# Patient Record
Sex: Female | Born: 1995 | Race: Black or African American | Hispanic: No | Marital: Single | Smoking: Never smoker
Health system: Southern US, Community
[De-identification: ages and names within clinical notes are randomized; demographics above are authoritative.]

## PROBLEM LIST (undated history)

## (undated) DIAGNOSIS — E785 Hyperlipidemia, unspecified: Secondary | ICD-10-CM

## (undated) DIAGNOSIS — D649 Anemia, unspecified: Secondary | ICD-10-CM

---

## 2015-01-05 ENCOUNTER — Encounter (HOSPITAL_COMMUNITY): Payer: Self-pay | Admitting: Emergency Medicine

## 2015-01-05 ENCOUNTER — Emergency Department (HOSPITAL_COMMUNITY)
Admission: EM | Admit: 2015-01-05 | Discharge: 2015-01-05 | Disposition: A | Discharge status: Home / Self Care | Payer: Medicaid Other | Attending: Emergency Medicine | Admitting: Emergency Medicine

## 2015-01-05 DIAGNOSIS — R300 Dysuria: Secondary | ICD-10-CM | POA: Diagnosis present

## 2015-01-05 DIAGNOSIS — Z3202 Encounter for pregnancy test, result negative: Secondary | ICD-10-CM | POA: Diagnosis not present

## 2015-01-05 DIAGNOSIS — N390 Urinary tract infection, site not specified: Secondary | ICD-10-CM | POA: Insufficient documentation

## 2015-01-05 LAB — URINALYSIS, ROUTINE W REFLEX MICROSCOPIC
Bilirubin Urine: NEGATIVE
GLUCOSE, UA: NEGATIVE mg/dL
Ketones, ur: 15 mg/dL — AB
NITRITE: NEGATIVE
PROTEIN: 30 mg/dL — AB
Specific Gravity, Urine: 1.019 (ref 1.005–1.030)
UROBILINOGEN UA: 0.2 mg/dL (ref 0.0–1.0)
pH: 6 (ref 5.0–8.0)

## 2015-01-05 LAB — URINE MICROSCOPIC-ADD ON

## 2015-01-05 LAB — POC URINE PREG, ED: Preg Test, Ur: NEGATIVE

## 2015-01-05 MED ORDER — NITROFURANTOIN MONOHYD MACRO 100 MG PO CAPS: 100.0000 mg | ORAL_CAPSULE | Freq: Two times a day (BID) | ORAL | Status: AC

## 2015-01-05 MED ORDER — NITROFURANTOIN MONOHYD MACRO 100 MG PO CAPS
100.0000 mg | ORAL_CAPSULE | Freq: Once | ORAL | Status: AC
  Administered 2015-01-05: 100 mg via ORAL
  Filled 2015-01-05: qty 1

## 2015-01-05 NOTE — ED Notes (Signed)
Pt. reports dysuria and malodorous urine onset 3 days ago , denies fever or chills.

## 2015-01-05 NOTE — ED Provider Notes (Signed)
CSN: [id number]     Arrival date & time 01/05/15  1912 History   First MD Initiated Contact with Patient 01/05/15 1925     Chief Complaint  Patient presents with  . Urinary Tract Infection     (Consider location/radiation/quality/duration/timing/severity/associated sxs/prior Treatment) HPI   This is a generally healthy 19 year old female with no significant past medical history who presents for evaluation of dysuria. Patient reports for the past 3 days she has noticed urinary frequency, urgency, strong urine odor, and burning with urination. Symptom has been persistent. No associated fever, chills, abdominal pain, back pain, vaginal bleeding vaginal discharge, or rash. She denies any pain with sexual activities, no prior history of STD. She is sexually active with one partner using protection every single time. Her last menstrual period was several days ago. She is currently on birth control, Ortho Tricyclic. Patient denies history of recurrent urinary tract infection, states she has never had a urinary tract infection in the past.  History reviewed. No pertinent past medical history. History reviewed. No pertinent past surgical history. No family history on file. History  Substance Use Topics  . Smoking status: Never Smoker   . Smokeless tobacco: Not on file  . Alcohol Use: Yes   OB History    No data available     Review of Systems  All other systems reviewed and are negative.     Allergies  Review of patient's allergies indicates no known allergies.  Home Medications   Prior to Admission medications   Not on File   BP 111/60 mmHg  Pulse 73  Temp(Src) 98.7 F (37.1 C) (Oral)  Resp 14  SpO2 100%  LMP 12/31/2014 Physical Exam  Constitutional: She appears well-developed and well-nourished. No distress.  HENT:  Head: Atraumatic.  Eyes: Conjunctivae are normal.  Neck: Neck supple.  Cardiovascular: Normal rate and regular rhythm.   Pulmonary/Chest: Effort normal and  breath sounds normal.  Abdominal: Soft. Bowel sounds are normal. She exhibits no distension. There is no tenderness. There is no rebound and no guarding.  Genitourinary:  No CVA tenderness  Neurological: She is alert.  Skin: No rash noted.  Psychiatric: She has a normal mood and affect.  Nursing note and vitals reviewed.   ED Course  Procedures (including critical care time)  7:38 PM Patient presents with dysuria. She is afebrile with stable normal vital sign and a benign abdomen. No pelvic pain on examination. No CVA tenderness.  9:41 PM Pregnancy test is negative, UA shows evidence of a urinary tract infection. This is uncomplicated urinary tract infection and will be treated with Macrobid. Patient is stable for discharge. Resources provided. Return precautions discussed.  Labs Review Labs Reviewed  URINALYSIS, ROUTINE W REFLEX MICROSCOPIC - Abnormal; Notable for the following:    APPearance CLOUDY (*)    Hgb urine dipstick LARGE (*)    Ketones, ur 15 (*)    Protein, ur 30 (*)    Leukocytes, UA LARGE (*)    All other components within normal limits  URINE MICROSCOPIC-ADD ON - Abnormal; Notable for the following:    Squamous Epithelial / LPF FEW (*)    Bacteria, UA MANY (*)    All other components within normal limits  POC URINE PREG, ED  POC URINE PREG, ED    Imaging Review No results found.   EKG Interpretation None      MDM   Final diagnoses:  UTI (lower urinary tract infection)    BP 104/55 mmHg  Pulse  75  Temp(Src) 98.3 F (36.8 C) (Oral)  Resp 14  SpO2 100%  LMP 12/31/2014     Fayrene HelperBowie Khalessi Blough, PA-C 01/05/15 2142  Flint MelterElliott L Wentz, MD 01/06/15 0025

## 2015-01-05 NOTE — Discharge Instructions (Signed)
Urinary Tract Infection °Urinary tract infections (UTIs) can develop anywhere along your urinary tract. Your urinary tract is your body's drainage system for removing wastes and extra water. Your urinary tract includes two kidneys, two ureters, a bladder, and a urethra. Your kidneys are a pair of bean-shaped organs. Each kidney is about the size of your fist. They are located below your ribs, one on each side of your spine. °CAUSES °Infections are caused by microbes, which are microscopic organisms, including fungi, viruses, and bacteria. These organisms are so small that they can only be seen through a microscope. Bacteria are the microbes that most commonly cause UTIs. °SYMPTOMS  °Symptoms of UTIs may vary by age and gender of the patient and by the location of the infection. Symptoms in young women typically include a frequent and intense urge to urinate and a painful, burning feeling in the bladder or urethra during urination. Older women and men are more likely to be tired, shaky, and weak and have muscle aches and abdominal pain. A fever may mean the infection is in your kidneys. Other symptoms of a kidney infection include pain in your back or sides below the ribs, nausea, and vomiting. °DIAGNOSIS °To diagnose a UTI, your caregiver will ask you about your symptoms. Your caregiver also will ask to provide a urine sample. The urine sample will be tested for bacteria and white blood cells. White blood cells are made by your body to help fight infection. °TREATMENT  °Typically, UTIs can be treated with medication. Because most UTIs are caused by a bacterial infection, they usually can be treated with the use of antibiotics. The choice of antibiotic and length of treatment depend on your symptoms and the type of bacteria causing your infection. °HOME CARE INSTRUCTIONS °· If you were prescribed antibiotics, take them exactly as your caregiver instructs you. Finish the medication even if you feel better after you  have only taken some of the medication. °· Drink enough water and fluids to keep your urine clear or pale yellow. °· Avoid caffeine, tea, and carbonated beverages. They tend to irritate your bladder. °· Empty your bladder often. Avoid holding urine for long periods of time. °· Empty your bladder before and after sexual intercourse. °· After a bowel movement, women should cleanse from front to back. Use each tissue only once. °SEEK MEDICAL CARE IF:  °· You have back pain. °· You develop a fever. °· Your symptoms do not begin to resolve within 3 days. °SEEK IMMEDIATE MEDICAL CARE IF:  °· You have severe back pain or lower abdominal pain. °· You develop chills. °· You have nausea or vomiting. °· You have continued burning or discomfort with urination. °MAKE SURE YOU:  °· Understand these instructions. °· Will watch your condition. °· Will get help right away if you are not doing well or get worse. °Document Released: 08/22/2005 Document Revised: 05/13/2012 Document Reviewed: 12/21/2011 °ExitCare® Patient Information ©2015 [employer], [employer]. This information is not intended to replace advice given to you by your health care provider. Make sure you discuss any questions you have with your health care provider. ° ° °Emergency Department Resource Guide °1) Find a Doctor and Pay Out of Pocket °Although you won't have to find out who is covered by your insurance plan, it is a good idea to ask around and get recommendations. You will then need to call the office and see if the doctor you have chosen will accept you as a new patient and what types of   options they offer for patients who are self-pay. Some doctors offer discounts or will set up payment plans for their patients who do not have insurance, but you will need to ask so you aren't surprised when you get to your appointment. ° °2) Contact Your Local Health Department °Not all health departments have doctors that can see patients for sick visits, but many do, so it is worth  a call to see if yours does. If you don't know where your local health department is, you can check in your phone book. The CDC also has a tool to help you locate your state's health department, and many state websites also have listings of all of their local health departments. ° °3) Find a Walk-in Clinic °If your illness is not likely to be very severe or complicated, you may want to try a walk in clinic. These are popping up all over the country in pharmacies, drugstores, and shopping centers. They're usually staffed by nurse practitioners or physician assistants that have been trained to treat common illnesses and complaints. They're usually fairly quick and inexpensive. However, if you have serious medical issues or chronic medical problems, these are probably not your best option. ° °No Primary Care Doctor: °- Call [facility] at  832-8000 - they can help you locate a primary care doctor that  accepts your insurance, provides certain services, etc. °- Physician Referral Service- 1-800-533-3463 ° °Chronic Pain Problems: °Organization         Address  Phone   Notes  °[facility]  (336) 297-2271 Patients need to be referred by their primary care doctor.  ° °Medication Assistance: °Organization         Address  Phone   Notes  °[place] [facility] [address] °[place], [place] [address] (336) 641-8030 --Must be a resident of [place] °-- Must have NO insurance coverage whatsoever (no Medicaid/ Medicare, etc.) °-- The pt. MUST have a primary care doctor that directs their care regularly and follows them in the community °  °[facility]  (866) 331-1348   °[employer]  (888) 892-1162   ° °Agencies that provide inexpensive medical care: °Organization         Address  Phone   Notes  °[facility]  (336) 832-8035   °[facility]    (336) 832-7272   °[facility] [address] °[place], [place]  [address] (336) 832-4777   °[facility] [address], °[place] (336) 271-4999   °[facility]    (336) 373-0678   °[facility]    (336) 272-1050   °[facility] ° [address], [place] Phone:  (336) 832-4444, Fax:  (336) 832-4440 Hours of Operation:  9 am - 6 pm, M-F.  Also accepts Medicaid/Medicare and self-pay.  °[facility] ° [address], [place] Phone: (336) 832-3150, Fax: (336) 832-3151. Hours of Operation:  8:30 am - 5:30 pm, M-F.  Also accepts Medicaid and self-pay.  °[facility] [place] [address], [place] Phone: (336) 878-6027   °[facility] [address], [place],  (336)723-1848, Ext. 123 Mondays & Thursdays: 7-9 AM.  First 15 patients are seen on a first come, first serve basis. °  ° °Medicaid-accepting [place] Providers: ° °Organization         Address  Phone   Notes  °  [facility] [address], [place] (336) 641-2100 Also accepts self-pay patients.  °[facility] [address], [place] ° (336) 856-9996   °[facility] [address], [place] (336) 288-8857   °[facility] [address] [place] [address], [place] (336) 299-7000   °Veita Bland [address], [place]  ° (336) 373-1557 Only accepts [place] Access Medicaid patients after they have their name applied to their card.  ° °Self-Pay (no insurance) in [place]: ° °Organization         Address  Phone   Notes  °Sickle Cell Patients, [facility] [address], [place] (336) 832-1970   °[facility] [address], [place] (336) 832-4400   °[facility] ° [address], [place] (336) 992-4800   °[facility]/Dr. Osei-Bonsu ° [address] [place] [address], [place] or [address], [place] (336) [phone] Phone number for both [place] and [place] locations is the same.  °[facility] [address], [place] (336) 299-0000   °[facility] [address] [place] [address], [place] or [address] (336) 852-7530 °(336) 878-2260   °[facility] [address], [place] (336) 350-1642, phone; (336) 294-5005, fax Sees patients 1st and 3rd Saturday of every month.  Must not qualify for public or private insurance (i.e. Medicaid, Medicare, [place] Health Choice, Veterans' Benefits) • Household income should be no more than 200% of the poverty level •The clinic cannot treat you if you are pregnant or think you are pregnant • Sexually transmitted diseases are not treated at the clinic.  ° ° °Dental Care: °Organization         Address  Phone  Notes  °[facility] [facility] [address], [place] (336) 641-6152 Accepts children up to age 21 who are enrolled in Medicaid or [place] Health Choice; pregnant women with a Medicaid card; and children who have applied for Medicaid or [place] Health Choice, but were declined, whose parents can pay a reduced fee at time of service.  °[facility] [place]  [address], [place] (336) 641-7733 Accepts children up to age 21 who are enrolled in Medicaid or [place] Health Choice; pregnant women with a Medicaid card; and children who have applied for Medicaid or [place] Health Choice, but were declined, whose parents can pay a reduced fee at time of service.  °[facility] ° [address], [place] (336) 641-4533 Patients are seen by appointment only. Walk-ins are not accepted. [facility] will see patients 18 years of age and older. °Monday - Tuesday (8am-5pm) °Most Wednesdays (8:30-5pm) °$30 per visit, cash only  °[facility] ° [address], [place] (336) 641-4533  Patients are seen by appointment only. Walk-ins are not accepted. [facility] will see patients 18 years of age and older. °One Wednesday Evening (Monthly: Volunteer Based).  $30 per visit, cash only  °[facility]  (919) 537-3737 for adults; Children under age 4, call Graduate Pediatric Dentistry at ([phone]) [phone]. Children aged 4-14, please call (919) [phone] to request a pediatric application. ° Dental services are provided in all areas of dental care including fillings, crowns and bridges, complete and partial   dentures, implants, gum treatment, root canals, and extractions. Preventive care is also provided. Treatment is provided to both adults and children. °Patients are selected via a lottery and there is often a waiting list. °  °[facility] [address], °[place] ° (336) 763-8833 [email or web address] °  °[facility] [address], [place], [place] (336)723-1848, Ext. 123 Second and Fourth Thursday of each month, opens at 6:30 AM; Clinic ends at 9 AM.  Patients are seen on a first-come first-served basis, and a limited number are seen during each clinic.  ° °[facility] ° [address], [place], [place] (336) 723-7904   Eligibility Requirements °You must have lived in [place], [place], or [place] counties for at least the last three months. °  You cannot be eligible for state or federal sponsored healthcare insurance, including Veterans Administration, Medicaid, or Medicare. °  You generally cannot be eligible for healthcare insurance through your employer.  °  How to apply: °Eligibility screenings are held every Tuesday and Wednesday afternoon from 1:00 pm until 4:00 pm. You do not need an appointment for the interview!  °[facility] [address], [place], [place] 336-631-2330   °[facility]  336-342-8273   °[place] [facility]  336-703-3100   °[facility]   336-570-6415   ° °Behavioral Health Resources in the Community: °Intensive Outpatient Programs °Organization         Address  Phone  Notes  °[facility] [address], [place], [place] 336-878-6098   °[facility] [address], [place], [place] 336-832-9800   °[facility] [address], [place], [place] ° 336-882-2125   °[facility] [address],  °[place], [place] 1-800-853-5163 or 336-641-4981   °Substance Abuse Resources °Organization         Address  Phone  Notes  °[facility]  336-882-2125   °[facility]  336-784-9470   °[facility]  336-285-9073   °[facility]  336-845-3988   °Residential & Outpatient Substance Abuse Program  1-800-659-3381   °Psychological Services °Organization         Address  Phone  Notes  °[facility]  336- 832-9600   °[facility]  336- 378-7881   °[facility] [address], [place] 1-800-853-5163 or 336-641-4981   ° °Mobile Crisis Teams °Organization         Address  Phone  Notes  °[facility], Mobile Crisis Care Unit  1-877-626-1772   °Assertive °[facility] ° [address] [place], [place] 336-834-9664   °Sharon DeEsch [address] °[place] 336-554-5454   ° °Self-Help/Support Groups °Organization         Address  Phone             Notes  °[facility] - variety of support groups  336- 373-1402 Call for more information  °Narcotics Anonymous (NA), [facility] [address], °[place]  2 meetings at this location  ° °Residential Treatment Programs °Organization         Address  Phone  Notes  °[facility] [address],    °[place]  1-866-801-8205   °[facility] ° [address], [place], [place] 704-293-8524   °[facility] [address], [place] 336-845-3988 Admissions: 8am-3pm M-F   °  [facility] [address]    °[place], [place] 336-841-1104   °[facility] [address], [place], [place] 336-379-7146   °[facility] [address]  °[place], [place] 336-285-9073   °[facility] Intensive Outpatient [address], [place], [place] 336-852-3033   °[facility] [address]  °[place], [place] 1-877-615-2722 or 336-784-9470   °[facility] ([facility] [address] [place], [place] 336-227-7417 Accepts Medicaid  °[facility] [address]  °[place] 1-800-659-3381 Substance Abuse/Addiction Treatment  ° °[place] Behavioral Health Resources °Organization         Address  Phone  Notes  °[facility]  (888) 581-9988   °Julie Brannon, PhD [address] [place], [place]   (336) 349-5553 or (336) 951-0000   °[facility]   [address] °[place], [place] (336) 349-4454   °[facility] [address], [place], [place] (336) 342-8316 Insurance/Medicaid/sponsorship through [facility]  °[facility] [address]                                    [place], [place] (336) 342-8316 Therapy/tele-psych/case  °[facility] [address]  ° [place],  (336) 349-2233    °Dr. Arfeen  (336) 349-4544   °[facility] [place]  [employer] [place] [facility] 1) [address], [place] °2) [address], [place] °3)  [address], [place] (336) 349-3220 °(336) 342-7768 ° °(336) 342-8140   °[place] [facility] (336) 342-1394 or (336) 342-3537 (After Hours)    ° ° ° ° °

## 2015-01-05 NOTE — ED Notes (Signed)
Bowie at the bedside to see the patient.

## 2015-01-12 ENCOUNTER — Encounter (HOSPITAL_COMMUNITY): Payer: Self-pay | Admitting: Family Medicine

## 2015-01-12 ENCOUNTER — Emergency Department (HOSPITAL_COMMUNITY): Payer: Medicaid Other

## 2015-01-12 ENCOUNTER — Emergency Department (HOSPITAL_COMMUNITY)
Admission: EM | Admit: 2015-01-12 | Discharge: 2015-01-12 | Disposition: A | Discharge status: Home / Self Care | Payer: Medicaid Other | Attending: Emergency Medicine | Admitting: Emergency Medicine

## 2015-01-12 DIAGNOSIS — Y9389 Activity, other specified: Secondary | ICD-10-CM | POA: Insufficient documentation

## 2015-01-12 DIAGNOSIS — S5002XA Contusion of left elbow, initial encounter: Secondary | ICD-10-CM | POA: Diagnosis not present

## 2015-01-12 DIAGNOSIS — W000XXA Fall on same level due to ice and snow, initial encounter: Secondary | ICD-10-CM | POA: Insufficient documentation

## 2015-01-12 DIAGNOSIS — S4992XA Unspecified injury of left shoulder and upper arm, initial encounter: Secondary | ICD-10-CM | POA: Diagnosis present

## 2015-01-12 DIAGNOSIS — Z792 Long term (current) use of antibiotics: Secondary | ICD-10-CM | POA: Diagnosis not present

## 2015-01-12 DIAGNOSIS — Y9289 Other specified places as the place of occurrence of the external cause: Secondary | ICD-10-CM | POA: Diagnosis not present

## 2015-01-12 DIAGNOSIS — S43402A Unspecified sprain of left shoulder joint, initial encounter: Secondary | ICD-10-CM | POA: Diagnosis not present

## 2015-01-12 DIAGNOSIS — Y998 Other external cause status: Secondary | ICD-10-CM | POA: Diagnosis not present

## 2015-01-12 MED ORDER — NAPROXEN 500 MG PO TABS: 500.0000 mg | ORAL_TABLET | Freq: Two times a day (BID) | ORAL | Status: AC

## 2015-01-12 NOTE — ED Notes (Signed)
Pt here after slip and fall on the ice a few days ago. sts hurt left shoulder and elbow.

## 2015-01-12 NOTE — Discharge Instructions (Signed)
Naprosyn for pain and inflammation. Keep arm in a sling for a week. Follow up with your doctor or with orthopedist as referred if not improving.   Shoulder Pain The shoulder is the joint that connects your arms to your body. The bones that form the shoulder joint include the upper arm bone (humerus), the shoulder blade (scapula), and the collarbone (clavicle). The top of the humerus is shaped like a ball and fits into a rather flat socket on the scapula (glenoid cavity). A combination of muscles and strong, fibrous tissues that connect muscles to bones (tendons) support your shoulder joint and hold the ball in the socket. Small, fluid-filled sacs (bursae) are located in different areas of the joint. They act as cushions between the bones and the overlying soft tissues and help reduce friction between the gliding tendons and the bone as you move your arm. Your shoulder joint allows a wide range of motion in your arm. This range of motion allows you to do things like scratch your back or throw a ball. However, this range of motion also makes your shoulder more prone to pain from overuse and injury. Causes of shoulder pain can originate from both injury and overuse and usually can be grouped in the following four categories:  Redness, swelling, and pain (inflammation) of the tendon (tendinitis) or the bursae (bursitis).  Instability, such as a dislocation of the joint.  Inflammation of the joint (arthritis).  Broken bone (fracture). HOME CARE INSTRUCTIONS   Apply ice to the sore area.  Put ice in a plastic bag.  Place a towel between your skin and the bag.  Leave the ice on for 15-20 minutes, 3-4 times per day for the first 2 days, or as directed by your health care provider.  Stop using cold packs if they do not help with the pain.  If you have a shoulder sling or immobilizer, wear it as long as your caregiver instructs. Only remove it to shower or bathe. Move your arm as little as possible,  but keep your hand moving to prevent swelling.  Squeeze a soft ball or foam pad as much as possible to help prevent swelling.  Only take over-the-counter or prescription medicines for pain, discomfort, or fever as directed by your caregiver. SEEK MEDICAL CARE IF:   Your shoulder pain increases, or new pain develops in your arm, hand, or fingers.  Your hand or fingers become cold and numb.  Your pain is not relieved with medicines. SEEK IMMEDIATE MEDICAL CARE IF:   Your arm, hand, or fingers are numb or tingling.  Your arm, hand, or fingers are significantly swollen or turn white or blue. MAKE SURE YOU:   Understand these instructions.  Will watch your condition.  Will get help right away if you are not doing well or get worse. Document Released: 08/22/2005 Document Revised: 03/29/2014 Document Reviewed: 10/27/2011 [facility] Patient Information 2015 [place]Care, [place]. This information is not intended to replace advice given to you by your health care provider. Make sure you discuss any questions you have with your health care provider.

## 2015-01-12 NOTE — ED Notes (Signed)
Patient transported to X-ray 

## 2015-01-12 NOTE — ED Provider Notes (Signed)
CSN: [id number]     Arrival date & time 01/12/15  1155 History  This chart was scribed for non-physician practitioner Jaynie Crumbleatyana Alayzia Pavlock, PA-C working with Juliet RudeNathan R. Rubin PayorPickering, MD by Littie Deedsichard Sun, ED Scribe. This patient was seen in room TR09C/TR09C and the patient's care was started at 1:55 PM.     Chief Complaint  Patient presents with  . Shoulder Pain   The history is provided by the patient. No language interpreter was used.   HPI Comments: Margaret Meyer is a 19 y.o. female who presents to the Emergency Department complaining of sudden onset left shoulder pain that started 2 days ago after slipping and falling on ice. Patient landed on her left arm and reports having left elbow pain in addition to her shoulder pain, but she states the shoulder pain is more severe. The pain is described as an 8/10 in severity. She has not tried anything for her pain. Patient does report having something like a tingling sensation, although she is not able to describe it well. She denies numbness.  Patient is in school currently and does not work. She is originally from [place] and is in the area for school.  History reviewed. No pertinent past medical history. History reviewed. No pertinent past surgical history. History reviewed. No pertinent family history. History  Substance Use Topics  . Smoking status: Never Smoker   . Smokeless tobacco: Not on file  . Alcohol Use: Yes   OB History    No data available     Review of Systems  Musculoskeletal: Positive for arthralgias. Negative for joint swelling.  Neurological: Negative for numbness.      Allergies  Nickel  Home Medications   Prior to Admission medications   Medication Sig Start Date End Date Taking? Authorizing Provider  nitrofurantoin, macrocrystal-monohydrate, (MACROBID) 100 MG capsule Take 1 capsule (100 mg total) by mouth 2 (two) times daily. X 7 days 01/05/15   Fayrene HelperBowie Tran, PA-C   BP 111/58 mmHg  Pulse 78  Temp(Src) 98 F (36.7  C) (Oral)  Resp 20  SpO2 100%  LMP 12/31/2014 Physical Exam  Constitutional: She is oriented to person, place, and time. She appears well-developed and well-nourished. No distress.  HENT:  Head: Normocephalic and atraumatic.  Mouth/Throat: Oropharynx is clear and moist. No oropharyngeal exudate.  Eyes: Pupils are equal, round, and reactive to light.  Neck: Neck supple.  Cardiovascular: Normal rate.   Pulmonary/Chest: Effort normal.  Musculoskeletal: She exhibits no edema.  Normal-appearing left elbow and shoulder. Elbow is nontender. Full range of motion. Bicep tricep strength is intact. Shoulder is diffusely tender over anterior joint, acromion, posterior joint. Pain with range of motion greater than 90. Pain with abduction. No pain with external or internal rotation. Normal wrist, normal distal radial pulse, grip strength is 5 out of 5.  Neurological: She is alert and oriented to person, place, and time. No cranial nerve deficit.  Skin: Skin is warm and dry. No rash noted.  Psychiatric: She has a normal mood and affect. Her behavior is normal.  Nursing note and vitals reviewed.   ED Course  Procedures  DIAGNOSTIC STUDIES: Oxygen Saturation is 100% on room air, normal by my interpretation.    COORDINATION OF CARE: 2:00 PM-Discussed treatment plan which includes sling and orthopedic follow-up with pt at bedside and pt agreed to plan.    Labs Review Labs Reviewed - No data to display  Imaging Review Dg Elbow Complete Left  01/12/2015   CLINICAL DATA:  Slipped  and fell several days ago. Initial evaluation.  EXAM: LEFT ELBOW - COMPLETE 3+ VIEW  COMPARISON:  None.  FINDINGS: No acute bony or joint abnormality identified. No fracture or dislocation.  IMPRESSION: No acute abnormality.   Electronically Signed   By: Maisie Fus  Register   On: 01/12/2015 13:16   Dg Shoulder Left  01/12/2015   CLINICAL DATA:  Slipped and fall on ice 2 days ago with persistent left shoulder pain, initial  encounter  EXAM: LEFT SHOULDER - 2+ VIEW  COMPARISON:  None.  FINDINGS: There is no evidence of fracture or dislocation. There is no evidence of arthropathy or other focal bone abnormality. Soft tissues are unremarkable.  IMPRESSION: No acute abnormality noted.   Electronically Signed   By: Alcide Clever M.D.   On: 01/12/2015 13:14     EKG Interpretation None      MDM   Final diagnoses:  Shoulder sprain, left, initial encounter  Elbow contusion, left, initial encounter     patient with left shoulder and elbow pain after falling 2 days ago. X-rays obtained are negative. Neurovascularly intact. However the sling, NSAIDs, follow up with orthopedics if not improving.  Filed Vitals:   01/12/15 1203  BP: 111/58  Pulse: 78  Temp: 98 F (36.7 C)  TempSrc: Oral  Resp: 20  SpO2: 100%   I personally performed the services described in this documentation, which was scribed in my presence. The recorded information has been reviewed and is accurate.    Lottie Mussel, PA-C 01/12/15 1406  Nathan R. Rubin Payor, MD 01/13/15 [phone]

## 2015-09-27 ENCOUNTER — Emergency Department (INDEPENDENT_AMBULATORY_CARE_PROVIDER_SITE_OTHER)
Admission: EM | Admit: 2015-09-27 | Discharge: 2015-09-27 | Disposition: A | Discharge status: Home / Self Care | Payer: Medicaid Other | Source: Home / Self Care | Attending: Emergency Medicine | Admitting: Emergency Medicine

## 2015-09-27 ENCOUNTER — Other Ambulatory Visit (HOSPITAL_COMMUNITY)
Admission: RE | Admit: 2015-09-27 | Discharge: 2015-09-27 | Disposition: A | Discharge status: Home / Self Care | Payer: Medicaid Other | Source: Ambulatory Visit | Attending: Emergency Medicine | Admitting: Emergency Medicine

## 2015-09-27 ENCOUNTER — Encounter (HOSPITAL_COMMUNITY): Payer: Self-pay | Admitting: Emergency Medicine

## 2015-09-27 DIAGNOSIS — R399 Unspecified symptoms and signs involving the genitourinary system: Secondary | ICD-10-CM

## 2015-09-27 HISTORY — DX: Hyperlipidemia, unspecified: E78.5

## 2015-09-27 HISTORY — DX: Anemia, unspecified: D64.9

## 2015-09-27 LAB — POCT PREGNANCY, URINE: Preg Test, Ur: NEGATIVE

## 2015-09-27 LAB — POCT URINALYSIS DIP (DEVICE)
BILIRUBIN URINE: NEGATIVE
Glucose, UA: NEGATIVE mg/dL
Hgb urine dipstick: NEGATIVE
Ketones, ur: NEGATIVE mg/dL
Leukocytes, UA: NEGATIVE
NITRITE: NEGATIVE
PH: 6.5 (ref 5.0–8.0)
PROTEIN: NEGATIVE mg/dL
Specific Gravity, Urine: 1.025 (ref 1.005–1.030)
Urobilinogen, UA: 1 mg/dL (ref 0.0–1.0)

## 2015-09-27 MED ORDER — CEPHALEXIN 500 MG PO CAPS: 500.0000 mg | ORAL_CAPSULE | Freq: Four times a day (QID) | ORAL | Status: AC

## 2015-09-27 NOTE — ED Notes (Signed)
Pt states she had some mild lower abdominal discomfort on Sunday and Monday and has been experiencing some cloudy urine and a little odor.  She denies any burning with urination, frequency or discharge.

## 2015-09-27 NOTE — ED Provider Notes (Signed)
CSN: [id number]     Arrival date & time 09/27/15  1331 History   First MD Initiated Contact with Patient 09/27/15 1406     Chief Complaint  Patient presents with  . Urinary Tract Infection   (Consider location/radiation/quality/duration/timing/severity/associated sxs/prior Treatment) HPI Comments: 19 year old female complaining of a 1-2 day history of malodorous and cloudy urine. She denies dysuria, frequency, urgency, pelvic pain or other symptoms. She states that 2 days ago she had mild transient lower abdominal pain but spontaneously resolved after a short time. Denies vaginal discharge or bleeding.   Past Medical History  Diagnosis Date  . Anemia   . Hyperlipemia    History reviewed. No pertinent past surgical history. History reviewed. No pertinent family history. Social History  Substance Use Topics  . Smoking status: Never Smoker   . Smokeless tobacco: None  . Alcohol Use: Yes   OB History    No data available     Review of Systems  Constitutional: Negative.   Respiratory: Negative.   Genitourinary: Positive for hematuria. Negative for dysuria, urgency, frequency, flank pain, vaginal discharge, vaginal pain and pelvic pain.  Musculoskeletal: Negative.   Neurological: Negative.     Allergies  Nickel  Home Medications   Prior to Admission medications   Medication Sig Start Date End Date Taking? Authorizing Provider  nitrofurantoin, macrocrystal-monohydrate, (MACROBID) 100 MG capsule Take 1 capsule (100 mg total) by mouth 2 (two) times daily. X 7 days 01/05/15  Yes Fayrene Helper, PA-C  cephALEXin (KEFLEX) 500 MG capsule Take 1 capsule (500 mg total) by mouth 4 (four) times daily. 09/27/15   Hayden Rasmussen, NP  naproxen (NAPROSYN) 500 MG tablet Take 1 tablet (500 mg total) by mouth 2 (two) times daily. 01/12/15   Jaynie Crumble, PA-C   Meds Ordered and Administered this Visit  Medications - No data to display  BP 101/65 mmHg  Pulse 75  Temp(Src) 98.1 F (36.7 C)  (Oral)  LMP 09/23/2015 (Exact Date) No data found.   Physical Exam  Constitutional: She is oriented to person, place, and time. She appears well-developed and well-nourished. No distress.  Eyes: EOM are normal.  Neck: Normal range of motion. Neck supple.  Cardiovascular: Normal rate.   Pulmonary/Chest: Effort normal. No respiratory distress.  Abdominal: Soft. She exhibits no distension. There is no tenderness. There is no rebound and no guarding.  Musculoskeletal: She exhibits no edema.  Neurological: She is alert and oriented to person, place, and time. She exhibits normal muscle tone.  Skin: Skin is warm and dry.  Psychiatric: She has a normal mood and affect.  Nursing note and vitals reviewed.   ED Course  Procedures (including critical care time)  Labs Review Labs Reviewed  URINE CULTURE  POCT PREGNANCY, URINE  POCT URINALYSIS DIP (DEVICE)   Results for orders placed or performed during the hospital encounter of 09/27/15  Pregnancy, urine POC  Result Value Ref Range   Preg Test, Ur NEGATIVE NEGATIVE  POCT urinalysis dip (device)  Result Value Ref Range   Glucose, UA NEGATIVE NEGATIVE mg/dL   Bilirubin Urine NEGATIVE NEGATIVE   Ketones, ur NEGATIVE NEGATIVE mg/dL   Specific Gravity, Urine 1.025 1.005 - 1.030   Hgb urine dipstick NEGATIVE NEGATIVE   pH 6.5 5.0 - 8.0   Protein, ur NEGATIVE NEGATIVE mg/dL   Urobilinogen, UA 1.0 0.0 - 1.0 mg/dL   Nitrite NEGATIVE NEGATIVE   Leukocytes, UA NEGATIVE NEGATIVE     Imaging Review No results found.   Visual Acuity  Review  Right Eye Distance:   Left Eye Distance:   Bilateral Distance:    Right Eye Near:   Left Eye Near:    Bilateral Near:         MDM   1. UTI symptoms    presumptive UTI Urine cult pending keflex    Hayden RasmussenDavid Trudie Cervantes, NP 09/27/15 1423

## 2015-09-28 LAB — URINE CULTURE: Special Requests: NORMAL

## 2015-09-29 NOTE — ED Notes (Signed)
Final report of urine C&S shows insignificant growth, consistent w negative culture

## 2016-07-16 IMAGING — CR DG SHOULDER 2+V*L*
3 series · 3 of 3 positions shown · non-contrast
Comparison: None.

CLINICAL DATA: Slipped and fall on ice 2 days ago with persistent
left shoulder pain, initial encounter

EXAM:
LEFT SHOULDER - 2+ VIEW

[w shoulder internal left]
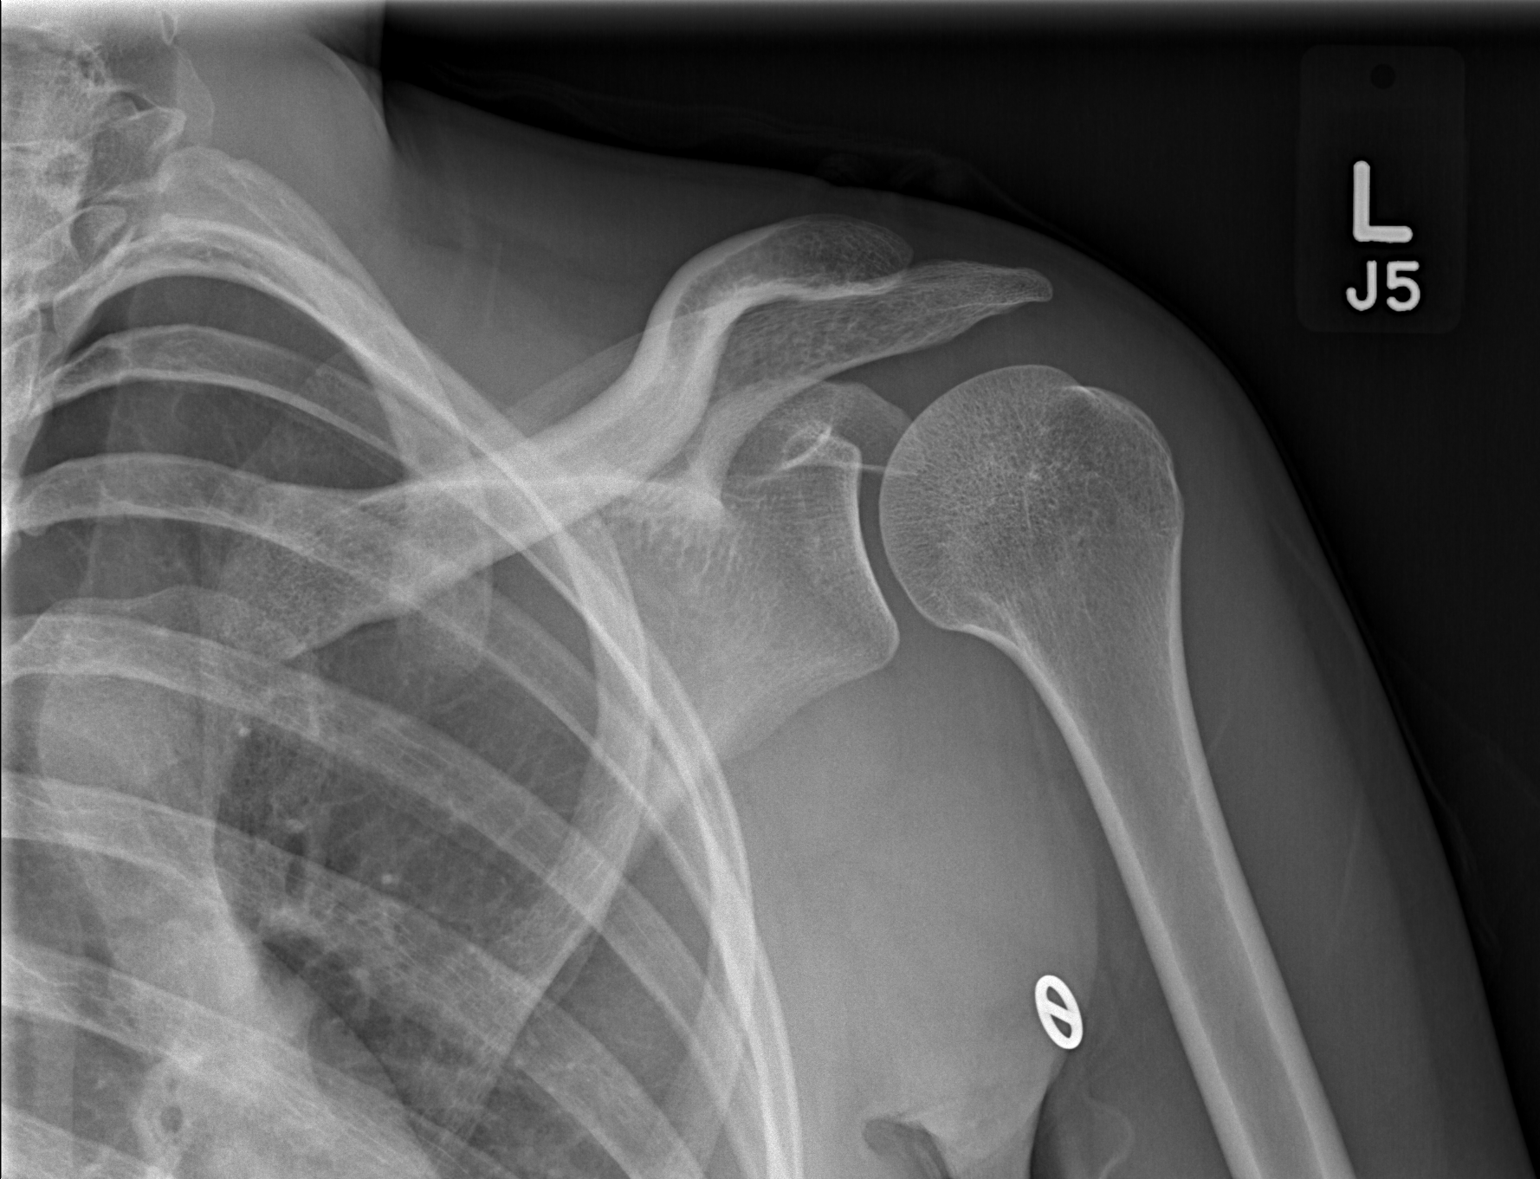

[w shoulder y-view left]
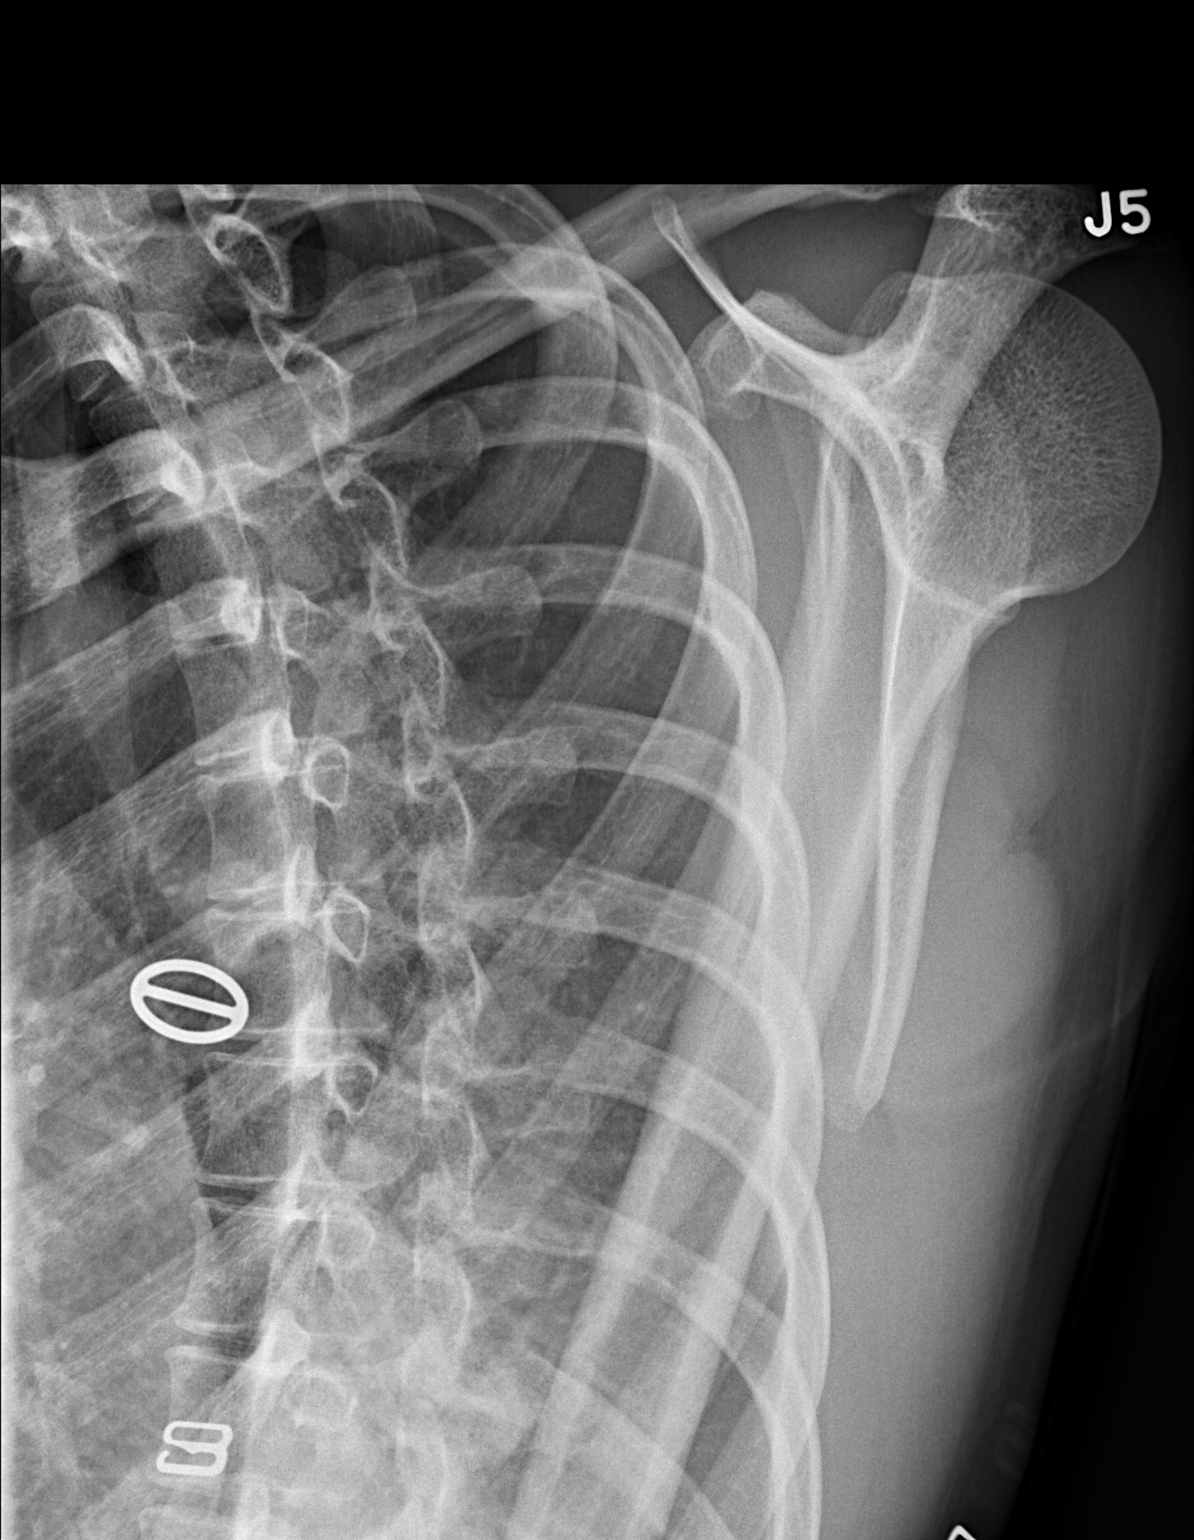

[x shoulder axillary left]
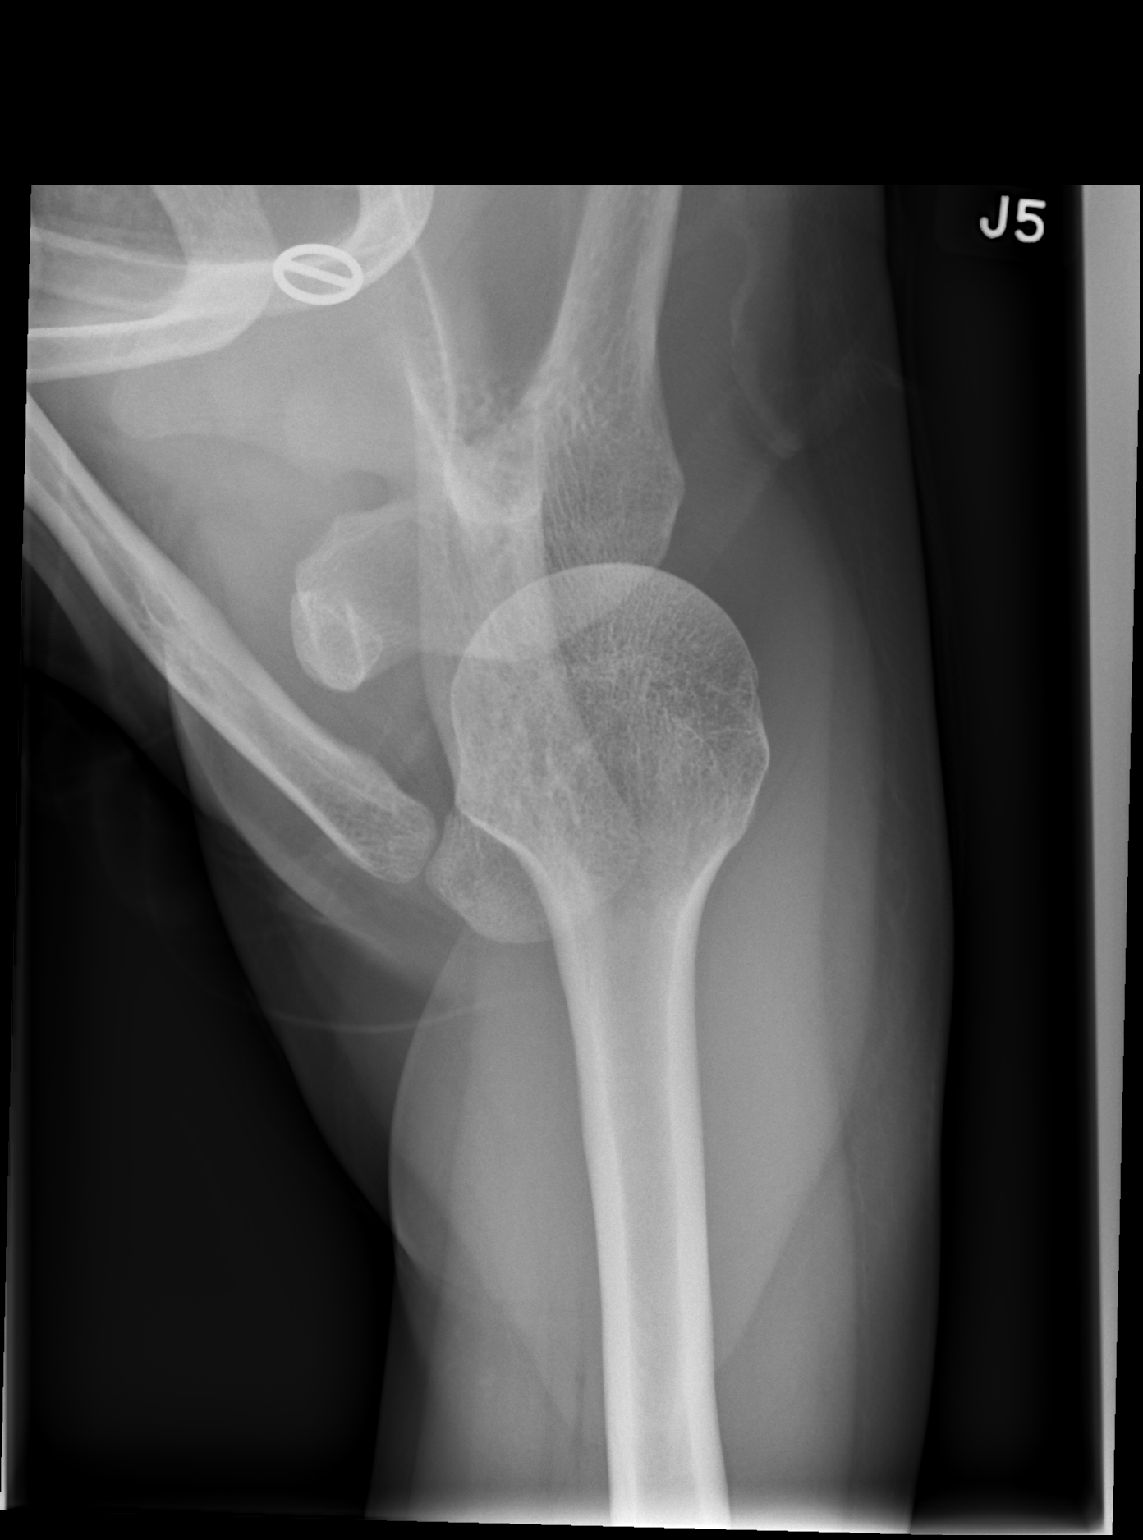

[3 of 3 positions shown; findings below may reference images not displayed]

FINDINGS: There is no evidence of fracture or dislocation. There is no
evidence of arthropathy or other focal bone abnormality. Soft
tissues are unremarkable.
IMPRESSION: No acute abnormality noted.
